# Patient Record
Sex: Male | Born: 1990
Health system: Southern US, Community
[De-identification: ages and names within clinical notes are randomized; demographics above are authoritative.]

## PROBLEM LIST (undated history)

## (undated) DIAGNOSIS — G43909 Migraine, unspecified, not intractable, without status migrainosus: Secondary | ICD-10-CM

## (undated) DIAGNOSIS — Q2381 Bicuspid aortic valve: Secondary | ICD-10-CM

## (undated) DIAGNOSIS — K311 Adult hypertrophic pyloric stenosis: Secondary | ICD-10-CM

## (undated) DIAGNOSIS — Q231 Congenital insufficiency of aortic valve: Secondary | ICD-10-CM

## (undated) HISTORY — PX: ABDOMINAL SURGERY: SHX537

## (undated) HISTORY — DX: Bicuspid aortic valve: Q23.81

## (undated) HISTORY — DX: Congenital insufficiency of aortic valve: Q23.1

## (undated) HISTORY — PX: TONSILLECTOMY: SUR1361

## (undated) HISTORY — PX: PYLOROMYOTOMY: SUR1063

---

## 2015-01-26 ENCOUNTER — Encounter: Payer: Self-pay | Admitting: *Deleted

## 2015-01-26 ENCOUNTER — Emergency Department
Admission: EM | Admit: 2015-01-26 | Discharge: 2015-01-26 | Disposition: A | Discharge status: Home / Self Care | Payer: Self-pay | Source: Home / Self Care | Attending: Family Medicine | Admitting: Family Medicine

## 2015-01-26 DIAGNOSIS — J029 Acute pharyngitis, unspecified: Secondary | ICD-10-CM

## 2015-01-26 HISTORY — DX: Migraine, unspecified, not intractable, without status migrainosus: G43.909

## 2015-01-26 HISTORY — DX: Adult hypertrophic pyloric stenosis: K31.1

## 2015-01-26 LAB — POCT RAPID STREP A (OFFICE): Rapid Strep A Screen: NEGATIVE

## 2015-01-26 MED ORDER — AMOXICILLIN 875 MG PO TABS: 875.0000 mg | ORAL_TABLET | Freq: Two times a day (BID) | ORAL | Status: DC

## 2015-01-26 MED ORDER — LIDOCAINE VISCOUS 2 % MT SOLN: 15.0000 mL | Freq: Three times a day (TID) | OROMUCOSAL | Status: DC

## 2015-01-26 MED ORDER — GUAIFENESIN-CODEINE 100-10 MG/5ML PO SOLN: ORAL | Status: DC

## 2015-01-26 NOTE — ED Notes (Signed)
Pt c/o sore throat, cough, chills and fever x 3 days.

## 2015-01-26 NOTE — ED Provider Notes (Signed)
CSN: [id number]     Arrival date & time 01/26/15  1555 History   First MD Initiated Contact with Patient 01/26/15 1622     Chief Complaint  Patient presents with  . Sore Throat      HPI Comments: Patient developed a sore throat three days ago which has become worse.  He has had myalgias, headache, fever, and chills.  He has had a mild cough over the past two days.  The history is provided by the patient.    Past Medical History  Diagnosis Date  . Migraine   . Aortic valve defect   . Pyloric stenosis    Past Surgical History  Procedure Laterality Date  . Pyloromyotomy    . Tonsillectomy    . Abdominal surgery     Family History  Problem Relation Age of Onset  . Hyperlipidemia Mother   . Hypertension Mother   . Migraines Mother   . Heart disease Father    History  Substance Use Topics  . Smoking status: Never Smoker   . Smokeless tobacco: Not on file  . Alcohol Use: No    Review of Systems + sore throat + hoarse + cough No pleuritic pain No wheezing + nasal congestion ? post-nasal drainage No sinus pain/pressure No itchy/red eyes No earache No hemoptysis No SOB + fever, + chills + nausea + vomiting, resolved No abdominal pain No diarrhea No urinary symptoms No skin rash + fatigue + myalgias + headache Used OTC meds without relief  Allergies  Review of patient's allergies indicates no known allergies.  Home Medications   Prior to Admission medications   Medication Sig Start Date End Date Taking? Authorizing Provider  SUMAtriptan-naproxen (TREXIMET) 85-500 MG per tablet Take 1 tablet by mouth every 2 (two) hours as needed for migraine.   Yes Historical Provider, MD  amoxicillin (AMOXIL) 875 MG tablet Take 1 tablet (875 mg total) by mouth 2 (two) times daily. 01/26/15   Lattie HawStephen A Beese, MD  guaiFENesin-codeine 100-10 MG/5ML syrup Take 10mL by mouth at bedtime as needed for cough 01/26/15   Lattie HawStephen A Beese, MD  lidocaine (XYLOCAINE) 2 % solution Use as  directed 15 mLs in the mouth or throat 4 (four) times daily -  before meals and at bedtime. Gargle, then expectorate 01/26/15   Lattie HawStephen A Beese, MD   BP 122/76 mmHg  Pulse 103  Temp(Src) 99.5 F (37.5 C) (Oral)  Resp 18  Ht 5\' 8"  (1.727 m)  Wt 234 lb (106.142 kg)  BMI 35.59 kg/m2  SpO2 99% Physical Exam Nursing notes and Vital Signs reviewed. Appearance:  Patient appears stated age, and in no acute distress.  Patient is obese (BMI 35.6) Eyes:  Pupils are equal, round, and reactive to light and accomodation.  Extraocular movement is intact.  Conjunctivae are not inflamed  Ears:  Canals normal.  Tympanic membranes normal.  Nose:  Mildly congested turbinates.  No sinus tenderness.    Pharynx:  Erythematous and slightly swollen without obstruction.  Neck:  Supple.  No adenopathy Lungs:  Clear to auscultation.  Breath sounds are equal.  Heart:  Regular rate and rhythm without murmurs, rubs, or gallops.  Abdomen:  Nontender without masses or hepatosplenomegaly.  Bowel sounds are present.  No CVA or flank tenderness.  Extremities:  No edema.  No calf tenderness Skin:  No rash present.   ED Course  Procedures  None    Labs Reviewed  STREP A DNA PROBE  POCT RAPID STREP A (  OFFICE) negative      MDM   1. Acute pharyngitis, unspecified pharyngitis type; ?false negative rapid strep.  Suspect viral URI also    Throat culture pending.  Begin amoxicillin  BID.  Robitussin AC at bedtime prn.  Lidocaine viscous gargles. Take plain guaifenesin  (Mucinex) twice daily, with plenty of water, for cough and congestion.  May add Pseudoephedrine for sinus congestion.  Get adequate rest.   Try warm salt water gargles for sore throat.  Stop all antihistamines for now, and other non-prescription cough/cold preparations. May take Ibuprofen , 4 tabs every 8 hours (or Aleve two tabs every 12 hours) with food for sore throat, headache, etc.    Lattie Haw, MD 01/30/15 1022

## 2015-01-26 NOTE — Discharge Instructions (Signed)
Take plain guaifenesin 1200mg  (Mucinex) twice daily, with plenty of water, for cough and congestion.  May add Pseudoephedrine for sinus congestion.  Get adequate rest.   Try warm salt water gargles for sore throat.  Stop all antihistamines for now, and other non-prescription cough/cold preparations. May take Ibuprofen 200mg , 4 tabs every 8 hours (or Aleve two tabs every 12 hours) with food for sore throat, headache, etc.

## 2015-01-27 LAB — STREP A DNA PROBE: GASP: NEGATIVE

## 2015-01-31 ENCOUNTER — Telehealth: Payer: Self-pay | Admitting: Emergency Medicine

## 2018-10-13 ENCOUNTER — Encounter: Payer: Self-pay | Admitting: Physician Assistant

## 2018-10-13 ENCOUNTER — Ambulatory Visit (INDEPENDENT_AMBULATORY_CARE_PROVIDER_SITE_OTHER): Payer: BLUE CROSS/BLUE SHIELD | Admitting: Physician Assistant

## 2018-10-13 VITALS — BP 115/72 | HR 59 | Temp 97.9°F | Ht 69.0 in | Wt 174.3 lb

## 2018-10-13 DIAGNOSIS — F341 Dysthymic disorder: Secondary | ICD-10-CM

## 2018-10-13 DIAGNOSIS — Z131 Encounter for screening for diabetes mellitus: Secondary | ICD-10-CM

## 2018-10-13 DIAGNOSIS — I351 Nonrheumatic aortic (valve) insufficiency: Secondary | ICD-10-CM | POA: Diagnosis not present

## 2018-10-13 DIAGNOSIS — G43009 Migraine without aura, not intractable, without status migrainosus: Secondary | ICD-10-CM

## 2018-10-13 DIAGNOSIS — Z130 Encounter for screening for diseases of the blood and blood-forming organs and certain disorders involving the immune mechanism: Secondary | ICD-10-CM

## 2018-10-13 DIAGNOSIS — Z7689 Persons encountering health services in other specified circumstances: Secondary | ICD-10-CM

## 2018-10-13 DIAGNOSIS — Z23 Encounter for immunization: Secondary | ICD-10-CM | POA: Diagnosis not present

## 2018-10-13 DIAGNOSIS — Z13220 Encounter for screening for lipoid disorders: Secondary | ICD-10-CM

## 2018-10-13 DIAGNOSIS — Q231 Congenital insufficiency of aortic valve: Secondary | ICD-10-CM | POA: Diagnosis not present

## 2018-10-13 MED ORDER — ESCITALOPRAM OXALATE 5 MG PO TABS: 5.0000 mg | ORAL_TABLET | Freq: Every day | ORAL | 3 refills | Status: DC

## 2018-10-13 MED ORDER — SUMATRIPTAN SUCCINATE 50 MG PO TABS: 50.0000 mg | ORAL_TABLET | ORAL | 2 refills | Status: DC | PRN

## 2018-10-13 NOTE — Patient Instructions (Signed)
For your migraines - keep track of your headache days with a log or diary (paper or app like MigraineBuddy, iHeadache) - optional B2 (riboflavin) and Magnesium for prophylaxis - take Imitrex at the first sign of migraine for abortive therapy - may repeat once after 2 hours if headache persists - do not use more than twice in 24 hours. And try not to use abortive medication more than 6 times per month - work on sleep hygiene. Stick to the same sleep schedule 7 days per week. - drink 1.5-2 L off water per day - reduce caffeine intake - avoid alcohol - don't skip meals   Migraine Headache A migraine headache is an intense, throbbing pain on one side or both sides of the head. Migraines may also cause other symptoms, such as nausea, vomiting, and sensitivity to light and noise. What are the causes? Doing or taking certain things may also trigger migraines, such as:  Alcohol.  Smoking.  Medicines, such as: ? Medicine used to treat chest pain (nitroglycerine). ? Birth control pills. ? Estrogen pills. ? Certain blood pressure medicines.  Aged cheeses, chocolate, or caffeine.  Foods or drinks that contain nitrates, glutamate, aspartame, or tyramine.  Physical activity.  Other things that may trigger a migraine include:  Menstruation.  Pregnancy.  Hunger.  Stress, lack of sleep, too much sleep, or fatigue.  Weather changes.  What increases the risk? The following factors may make you more likely to experience migraine headaches:  Age. Risk increases with age.  Family history of migraine headaches.  Being Caucasian.  Depression and anxiety.  Obesity.  Being a woman.  Having a hole in the heart (patent foramen ovale) or other heart problems.  What are the signs or symptoms? The main symptom of this condition is pulsating or throbbing pain. Pain may:  Happen in any area of the head, such as on one side or both sides.  Interfere with daily activities.  Get  worse with physical activity.  Get worse with exposure to bright lights or loud noises.  Other symptoms may include:  Nausea.  Vomiting.  Dizziness.  General sensitivity to bright lights, loud noises, or smells.  Before you get a migraine, you may get warning signs that a migraine is developing (aura). An aura may include:  Seeing flashing lights or having blind spots.  Seeing bright spots, halos, or zigzag lines.  Having tunnel vision or blurred vision.  Having numbness or a tingling feeling.  Having trouble talking.  Having muscle weakness.  How is this diagnosed? A migraine headache can be diagnosed based on:  Your symptoms.  A physical exam.  Tests, such as CT scan or MRI of the head. These imaging tests can help rule out other causes of headaches.  Taking fluid from the spine (lumbar puncture) and analyzing it (cerebrospinal fluid analysis, or CSF analysis).  How is this treated? A migraine headache is usually treated with medicines that:  Relieve pain.  Relieve nausea.  Prevent migraines from coming back.  Treatment may also include:  Acupuncture.  Lifestyle changes like avoiding foods that trigger migraines.  Follow these instructions at home: Medicines  Take over-the-counter and prescription medicines only as told by your health care provider.  Do not drive or use heavy machinery while taking prescription pain medicine.  To prevent or treat constipation while you are taking prescription pain medicine, your health care provider may recommend that you: ? Drink enough fluid to keep your urine clear or pale yellow. ?  Take over-the-counter or prescription medicines. ? Eat foods that are high in fiber, such as fresh fruits and vegetables, whole grains, and beans. ? Limit foods that are high in fat and processed sugars, such as fried and sweet foods. Lifestyle  Avoid alcohol use.  Do not use any products that contain nicotine or tobacco, such as  cigarettes and e-cigarettes. If you need help quitting, ask your health care provider.  Get at least 8 hours of sleep every night.  Limit your stress. General instructions   Keep a journal to find out what may trigger your migraine headaches. For example, write down: ? What you eat and drink. ? How much sleep you get. ? Any change to your diet or medicines.  If you have a migraine: ? Avoid things that make your symptoms worse, such as bright lights. ? It may help to lie down in a dark, quiet room. ? Do not drive or use heavy machinery. ? Ask your health care provider what activities are safe for you while you are experiencing symptoms.  Keep all follow-up visits as told by your health care provider. This is important. Contact a health care provider if:  You develop symptoms that are different or more severe than your usual migraine symptoms. Get help right away if:  Your migraine becomes severe.  You have a fever.  You have a stiff neck.  You have vision loss.  Your muscles feel weak or like you cannot control them.  You start to lose your balance often.  You develop trouble walking.  You faint. This information is not intended to replace advice given to you by your health care provider. Make sure you discuss any questions you have with your health care provider. Document Released: 12/03/2005 Document Revised: 06/22/2016 Document Reviewed: 05/21/2016 Elsevier Interactive Patient Education  2017 [employer].

## 2018-10-13 NOTE — Progress Notes (Signed)
HPI:                                                                Jake [facility] is a 27 y.o. male who presents to [facility] today to establish care  Current concerns:   Migraines: reports he was diagnosed with migraines at age 61. States they went away for years and returned in the last year. States headaches are unilateral, associated with blurred vision and photophobia, no aura. Denies paresthesias or focal weakness. Denies syncope or seizure activity. Reports he is having 1 headache per month lasting several hours. Not missing any work or school.  Relieved by sleep and Excedrin migraine.    Bicuspid aortic valve. Last echo was 05/2010 and showed mild AI. He denies change in exercise tolerance, dyspnea on exertion, cyanosis, syncope. He frequently runs long distances (6-10 miles several days per week)  States he thinks he may be depressed. He feels bored when he is with friends or playing games he used to enjoy. He used to take Lexapro and states this worked well for him. Denies symptoms of mania/hypomania. Denies suicidal thinking. Denies auditory/visual hallucinations.    Depression screen PHQ 2/9 10/13/2018  Decreased Interest 1  Down, Depressed, Hopeless 0  PHQ - 2 Score 1  Altered sleeping 1  Tired, decreased energy 0  Change in appetite 0  Feeling bad or failure about yourself  0  Trouble concentrating 0  Moving slowly or fidgety/restless 0  Suicidal thoughts 0  PHQ-9 Score 2  Difficult doing work/chores Not difficult at all    GAD 7 : Generalized Anxiety Score 10/13/2018  Nervous, Anxious, on Edge 0  Control/stop worrying 1  Worry too much - different things 0  Trouble relaxing 0  Restless 0  Easily annoyed or irritable 0  Afraid - awful might happen 0  Total GAD 7 Score 1  Anxiety Difficulty Not difficult at all      Past Medical History:  Diagnosis Date  . Aortic valve defect   . Migraine   . Pyloric  stenosis    Past Surgical History:  Procedure Laterality Date  . ABDOMINAL SURGERY    . PYLOROMYOTOMY    . TONSILLECTOMY     Social History   Tobacco Use  . Smoking status: Never Smoker  . Smokeless tobacco: Never Used  Substance Use Topics  . Alcohol use: Not Currently   family history includes Heart disease in his father; Hyperlipidemia in his mother; Hypertension in his mother; Migraines in his mother.    ROS: negative except as noted in the HPI  Medications: Current Outpatient Medications  Medication Sig Dispense Refill  . escitalopram (LEXAPRO) 5 MG tablet Take 1 tablet (5 mg total) by mouth at bedtime. 30 tablet 3  . SUMAtriptan (IMITREX) 50 MG tablet Take 1 tablet (50 mg total) by mouth every 2 (two) hours as needed for migraine. May repeat in 2 hours if headache persists or recurs. 10 tablet 2   No current facility-administered medications for this visit.    No Known Allergies     Objective:  BP 115/72   Pulse (!) 59   Temp 97.9 F (36.6 C) (Oral)   Ht 5\' 9"  (1.753 m)   Wt  174 lb 4.8 oz (79.1 kg)   SpO2 98%   BMI 25.74 kg/m  Gen:  alert, not ill-appearing, no distress, appropriate for age HEENT: head normocephalic without obvious abnormality, conjunctiva and cornea clear, trachea midline Pulm: Normal work of breathing, normal phonation, clear to auscultation bilaterally, no wheezes, rales or rhonchi CV: Normal rate, regular rhythm, s1 and s2 distinct, grade II/VI systolic murmur Neuro: alert and oriented x 3, no tremor MSK: extremities atraumatic, normal gait and station Skin: intact, no rashes on exposed skin, no jaundice, no cyanosis Psych: well-groomed, cooperative, good eye contact, mood "I think I'm depressed. Bored", affect mood-congruent, speech is articulate, and thought processes clear and goal-directed    No results found for this or any previous visit (from the past 72 hour(s)). No results found.    Assessment and Plan: 27 y.o. male  with   .[facility] was seen today for establish care.  Diagnoses and all orders for this visit:  Encounter to establish care  Bicuspid aortic valve -     ECHOCARDIOGRAM COMPLETE; Future -     ECHOCARDIOGRAM COMPLETE  Mild aortic insufficiency -     ECHOCARDIOGRAM COMPLETE; Future -     ECHOCARDIOGRAM COMPLETE  Dysthymia -     escitalopram (LEXAPRO) 5 MG tablet; Take 1 tablet (5 mg total) by mouth at bedtime.  Migraine without aura and without status migrainosus, not intractable -     SUMAtriptan (IMITREX) 50 MG tablet; Take 1 tablet (50 mg total) by mouth every 2 (two) hours as needed for migraine. May repeat in 2 hours if headache persists or recurs.  Screening for lipid disorders -     Lipid Panel w/reflex Direct LDL  Screening for diabetes mellitus -     Comprehensive metabolic panel  Screening for blood disease -     CBC -     Comprehensive metabolic panel  Need for immunization against influenza -     Flu Vaccine QUAD 36+ mos IM   - Personally reviewed PMH, PSH, PFH, medications, allergies, HM - Age-appropriate cancer screening: n/a - Influenza given today - Tdap UTD - PHQ2 negative  Migraine w/o aura - no red flag symptoms. 1 headache day per month. Not a candidate for preventive medication and he is not interested in this. Continue Excedrin prn. Re-start Imitrex prn  Dysthymia - PHQ9=2. Patient c/o anhedonia and desires to re-start Lexapro. Will start low-dose 5 mg   Bicuspid aortic valve - asymptomatic. It has been 8 years since his last echo. Personally reviewed report showing mild AI, normal LVEF. Repeat echo for surveillance of AI   Patient education and anticipatory guidance given Patient agrees with treatment plan Follow-up in 8 weeks for migraines / mood or sooner as needed if symptoms worsen or fail to improve  Jake Hubert PA-C

## 2018-10-14 DIAGNOSIS — Z13220 Encounter for screening for lipoid disorders: Secondary | ICD-10-CM | POA: Diagnosis not present

## 2018-10-14 DIAGNOSIS — Z131 Encounter for screening for diabetes mellitus: Secondary | ICD-10-CM | POA: Diagnosis not present

## 2018-10-14 DIAGNOSIS — Z130 Encounter for screening for diseases of the blood and blood-forming organs and certain disorders involving the immune mechanism: Secondary | ICD-10-CM | POA: Diagnosis not present

## 2018-10-15 ENCOUNTER — Encounter: Payer: Self-pay | Admitting: Physician Assistant

## 2018-10-15 LAB — COMPREHENSIVE METABOLIC PANEL
AG Ratio: 1.8 (calc) (ref 1.0–2.5)
ALBUMIN MSPROF: 4.6 g/dL (ref 3.6–5.1)
ALKALINE PHOSPHATASE (APISO): 86 U/L (ref 40–115)
ALT: 29 U/L (ref 9–46)
AST: 33 U/L (ref 10–40)
BUN/Creatinine Ratio: 25 (calc) — ABNORMAL HIGH (ref 6–22)
BUN: 26 mg/dL — AB (ref 7–25)
CALCIUM: 10.1 mg/dL (ref 8.6–10.3)
CO2: 28 mmol/L (ref 20–32)
Chloride: 101 mmol/L (ref 98–110)
Creat: 1.05 mg/dL (ref 0.60–1.35)
Globulin: 2.5 g/dL (calc) (ref 1.9–3.7)
Glucose, Bld: 85 mg/dL (ref 65–99)
Potassium: 3.8 mmol/L (ref 3.5–5.3)
Sodium: 139 mmol/L (ref 135–146)
Total Bilirubin: 2.1 mg/dL — ABNORMAL HIGH (ref 0.2–1.2)
Total Protein: 7.1 g/dL (ref 6.1–8.1)

## 2018-10-15 LAB — CBC
HCT: 45 % (ref 38.5–50.0)
HEMOGLOBIN: 15.9 g/dL (ref 13.2–17.1)
MCH: 31.5 pg (ref 27.0–33.0)
MCHC: 35.3 g/dL (ref 32.0–36.0)
MCV: 89.3 fL (ref 80.0–100.0)
MPV: 10.3 fL (ref 7.5–12.5)
Platelets: 185 10*3/uL (ref 140–400)
RBC: 5.04 10*6/uL (ref 4.20–5.80)
RDW: 12 % (ref 11.0–15.0)
WBC: 7.8 10*3/uL (ref 3.8–10.8)

## 2018-10-15 LAB — LIPID PANEL W/REFLEX DIRECT LDL
CHOLESTEROL: 163 mg/dL (ref <200)
HDL: 65 mg/dL (ref >40)
LDL CHOLESTEROL (CALC): 82 mg/dL
Non-HDL Cholesterol (Calc): 98 mg/dL (calc) (ref <130)
Total CHOL/HDL Ratio: 2.5 (calc) (ref <5.0)
Triglycerides: 76 mg/dL (ref <150)

## 2018-10-31 ENCOUNTER — Ambulatory Visit (HOSPITAL_BASED_OUTPATIENT_CLINIC_OR_DEPARTMENT_OTHER)
Admission: RE | Admit: 2018-10-31 | Discharge: 2018-10-31 | Disposition: A | Discharge status: Home / Self Care | Payer: BLUE CROSS/BLUE SHIELD | Source: Ambulatory Visit | Attending: Physician Assistant | Admitting: Physician Assistant

## 2018-10-31 DIAGNOSIS — Q231 Congenital insufficiency of aortic valve: Secondary | ICD-10-CM

## 2018-10-31 DIAGNOSIS — I351 Nonrheumatic aortic (valve) insufficiency: Secondary | ICD-10-CM | POA: Diagnosis not present

## 2018-10-31 NOTE — Progress Notes (Signed)
  Echocardiogram 2D Echocardiogram has been performed.  Elkin Belfield T Neda Willenbring 10/31/2018, 10:51 AM

## 2018-11-03 NOTE — Addendum Note (Signed)
Addended by: Gena FrayUMMINGS, CHARLEY E on: 11/03/2018 08:24 AM   Modules accepted: Orders

## 2018-12-29 NOTE — Progress Notes (Deleted)
Referring-Charlie Doran Heater, PA-C Reason for referral-bicuspid aortic valve  HPI: 28 year old male for evaluation of bicuspid aortic valve at request of Mardelle Matte, PA-C.  Last echocardiogram November 2019 showed normal LV function, bicuspid aortic valve with mild aortic insufficiency.  Current Outpatient Medications  Medication Sig Dispense Refill  . escitalopram (LEXAPRO) 5 MG tablet Take 1 tablet (5 mg total) by mouth at bedtime. 30 tablet 3  . SUMAtriptan (IMITREX) 50 MG tablet Take 1 tablet (50 mg total) by mouth every 2 (two) hours as needed for migraine. May repeat in 2 hours if headache persists or recurs. 10 tablet 2   No current facility-administered medications for this visit.     No Known Allergies  Past Medical History:  Diagnosis Date  . Aortic valve defect   . Migraine   . Pyloric stenosis     Past Surgical History:  Procedure Laterality Date  . ABDOMINAL SURGERY    . PYLOROMYOTOMY    . TONSILLECTOMY      Social History   Socioeconomic History  . Marital status: Single    Spouse name: Not on file  . Number of children: Not on file  . Years of education: Not on file  . Highest education level: Not on file  Occupational History  . Not on file  Social Needs  . Financial resource strain: Not on file  . Food insecurity:    Worry: Not on file    Inability: Not on file  . Transportation needs:    Medical: Not on file    Non-medical: Not on file  Tobacco Use  . Smoking status: Never Smoker  . Smokeless tobacco: Never Used  Substance and Sexual Activity  . Alcohol use: Not Currently  . Drug use: Never  . Sexual activity: Yes    Birth control/protection: Condom  Lifestyle  . Physical activity:    Days per week: Not on file    Minutes per session: Not on file  . Stress: Not on file  Relationships  . Social connections:    Talks on phone: Not on file    Gets together: Not on file    Attends religious service: Not on  file    Active member of club or organization: Not on file    Attends meetings of clubs or organizations: Not on file    Relationship status: Not on file  . Intimate partner violence:    Fear of current or ex partner: Not on file    Emotionally abused: Not on file    Physically abused: Not on file    Forced sexual activity: Not on file  Other Topics Concern  . Not on file  Social History Narrative  . Not on file    Family History  Problem Relation Age of Onset  . Hyperlipidemia Mother   . Hypertension Mother   . Migraines Mother   . Heart disease Father     ROS: no fevers or chills, productive cough, hemoptysis, dysphasia, odynophagia, melena, hematochezia, dysuria, hematuria, rash, seizure activity, orthopnea, PND, pedal edema, claudication. Remaining systems are negative.  Physical Exam:   There were no vitals taken for this visit.  General:  Well developed/well nourished in NAD Skin warm/dry Patient not depressed No peripheral clubbing Back-normal HEENT-normal/normal eyelids Neck supple/normal carotid upstroke bilaterally; no bruits; no JVD; no thyromegaly chest - CTA/ normal expansion CV - RRR/normal S1 and S2; no murmurs, rubs or gallops;  PMI nondisplaced Abdomen -NT/ND, no HSM, no mass, +  bowel sounds, no bruit 2+ femoral pulses, no bruits Ext-no edema, chords, 2+ DP Neuro-grossly nonfocal  ECG - personally reviewed  A/P  1  Olga Millers, MD

## 2018-12-31 ENCOUNTER — Ambulatory Visit: Payer: BLUE CROSS/BLUE SHIELD | Admitting: Cardiology

## 2019-01-05 NOTE — Progress Notes (Signed)
Referring-Charley Doran HeaterElizabeth Cummings, PA-C Reason for referral-bicuspid aortic valve  HPI: 28 year old male for evaluation of bicuspid aortic valve at request of Carlis Stableharley Elizabeth Cummings, PA-C.  Echocardiogram November 2019 showed normal LV function, bicuspid aortic valve, mild aortic insufficiency.  There is no dyspnea, chest pain, palpitations or syncope.  Current Outpatient Medications  Medication Sig Dispense Refill  . escitalopram (LEXAPRO) 5 MG tablet Take 1 tablet (5 mg total) by mouth at bedtime. 30 tablet 3  . SUMAtriptan (IMITREX) 50 MG tablet Take 1 tablet (50 mg total) by mouth every 2 (two) hours as needed for migraine. May repeat in 2 hours if headache persists or recurs. 10 tablet 2   No current facility-administered medications for this visit.     No Known Allergies   Past Medical History:  Diagnosis Date  . Bicuspid aortic valve   . Migraine   . Pyloric stenosis     Past Surgical History:  Procedure Laterality Date  . ABDOMINAL SURGERY    . PYLOROMYOTOMY    . TONSILLECTOMY      Social History   Socioeconomic History  . Marital status: Single    Spouse name: Not on file  . Number of children: Not on file  . Years of education: Not on file  . Highest education level: Not on file  Occupational History  . Not on file  Social Needs  . Financial resource strain: Not on file  . Food insecurity:    Worry: Not on file    Inability: Not on file  . Transportation needs:    Medical: Not on file    Non-medical: Not on file  Tobacco Use  . Smoking status: Never Smoker  . Smokeless tobacco: Never Used  Substance and Sexual Activity  . Alcohol use: Not Currently  . Drug use: Never  . Sexual activity: Yes    Birth control/protection: Condom  Lifestyle  . Physical activity:    Days per week: Not on file    Minutes per session: Not on file  . Stress: Not on file  Relationships  . Social connections:    Talks on phone: Not on file    Gets together:  Not on file    Attends religious service: Not on file    Active member of club or organization: Not on file    Attends meetings of clubs or organizations: Not on file    Relationship status: Not on file  . Intimate partner violence:    Fear of current or ex partner: Not on file    Emotionally abused: Not on file    Physically abused: Not on file    Forced sexual activity: Not on file  Other Topics Concern  . Not on file  Social History Narrative  . Not on file    Family History  Problem Relation Age of Onset  . Hyperlipidemia Mother   . Hypertension Mother   . Migraines Mother   . Heart disease Father     ROS: no fevers or chills, productive cough, hemoptysis, dysphasia, odynophagia, melena, hematochezia, dysuria, hematuria, rash, seizure activity, orthopnea, PND, pedal edema, claudication. Remaining systems are negative.  Physical Exam:   Blood pressure 114/72, pulse 63, height 5\' 9"  (1.753 m), weight 178 lb (80.7 kg).  General:  Well developed/well nourished in NAD Skin warm/dry Patient not depressed No peripheral clubbing Back-normal HEENT-normal/normal eyelids Neck supple/normal carotid upstroke bilaterally; no bruits; no JVD; no thyromegaly chest - CTA/ normal expansion CV - RRR/normal S1  and S2; no murmurs, rubs or gallops;  PMI nondisplaced Abdomen -NT/ND, no HSM, no mass, + bowel sounds, no bruit 2+ femoral pulses, no bruits Ext-no edema, chords, 2+ DP Neuro-grossly nonfocal  ECG -normal sinus rhythm at a rate of 63.  No ST changes.  Personally reviewed  A/P  1 bicuspid aortic valve-I have personally reviewed the patient's recent transthoracic echocardiogram.  He has a bicuspid valve with mild aortic insufficiency.  He will need follow-up studies in the future.  We will arrange a CTA to rule out thoracic aortic aneurysm.  I will also order an MRA of his cerebral circulation to exclude cerebral aneurysm given increased incidence in patients with bicuspid aortic  valves.  2 HA-occasional migraines; given bicuspid aortic valve and association with cerebral aneurysms will arrange MRA to exclude.  Olga Millers, MD

## 2019-01-06 ENCOUNTER — Ambulatory Visit (INDEPENDENT_AMBULATORY_CARE_PROVIDER_SITE_OTHER): Payer: BLUE CROSS/BLUE SHIELD | Admitting: Cardiology

## 2019-01-06 ENCOUNTER — Encounter: Payer: Self-pay | Admitting: Cardiology

## 2019-01-06 VITALS — BP 114/72 | HR 63 | Ht 69.0 in | Wt 178.0 lb

## 2019-01-06 DIAGNOSIS — Q231 Congenital insufficiency of aortic valve: Secondary | ICD-10-CM

## 2019-01-06 DIAGNOSIS — G4489 Other headache syndrome: Secondary | ICD-10-CM

## 2019-01-06 NOTE — Patient Instructions (Signed)
Medication Instructions:  NO CHANGE If you need a refill on your cardiac medications before your next appointment, please call your pharmacy.   Lab work: If you have labs (blood work) drawn today and your tests are completely normal, you will receive your results only by: Marland Kitchen MyChart Message (if you have MyChart) OR . A paper copy in the mail If you have any lab test that is abnormal or we need to change your treatment, we will call you to review the results.  Testing/Procedures: CTA OF THE CHEST W/WO TO R/O THORACIC ANEURYSM=[address]  MRA OF THE HEAD TO R/O ANEURYSM=[address]  Follow-Up: At [facility], you and your health needs are our priority.  As part of our continuing mission to provide you with exceptional heart care, we have created designated Provider Care Teams.  These Care Teams include your primary Cardiologist (physician) and Advanced Practice Providers (APPs -  Physician Assistants and Nurse Practitioners) who all work together to provide you with the care you need, when you need it. You will need a follow up appointment in 24 months.  Please call our office 2 months in advance to schedule this appointment.  You may see Olga Millers MD or one of the following Advanced Practice Providers on your designated Care Team:   Corine Shelter, PA-C Judy Pimple, [place] . Marjie Skiff, PA-C

## 2019-01-14 ENCOUNTER — Ambulatory Visit
Admission: RE | Admit: 2019-01-14 | Discharge: 2019-01-14 | Disposition: A | Discharge status: Home / Self Care | Payer: BLUE CROSS/BLUE SHIELD | Source: Ambulatory Visit | Attending: Cardiology | Admitting: Cardiology

## 2019-01-14 DIAGNOSIS — Z952 Presence of prosthetic heart valve: Secondary | ICD-10-CM | POA: Diagnosis not present

## 2019-01-14 DIAGNOSIS — Q231 Congenital insufficiency of aortic valve: Secondary | ICD-10-CM

## 2019-01-14 DIAGNOSIS — I350 Nonrheumatic aortic (valve) stenosis: Secondary | ICD-10-CM | POA: Diagnosis not present

## 2019-01-14 MED ORDER — IOPAMIDOL (ISOVUE-370) INJECTION 76%
75.0000 mL | Freq: Once | INTRAVENOUS | Status: AC | PRN
  Administered 2019-01-14: 75 mL via INTRAVENOUS

## 2019-02-04 ENCOUNTER — Other Ambulatory Visit: Payer: Self-pay | Admitting: Physician Assistant

## 2019-02-04 DIAGNOSIS — F341 Dysthymic disorder: Secondary | ICD-10-CM

## 2019-03-10 ENCOUNTER — Other Ambulatory Visit: Payer: Self-pay | Admitting: Physician Assistant

## 2019-03-10 DIAGNOSIS — F341 Dysthymic disorder: Secondary | ICD-10-CM

## 2019-04-07 ENCOUNTER — Other Ambulatory Visit: Payer: Self-pay | Admitting: Physician Assistant

## 2019-04-07 DIAGNOSIS — F341 Dysthymic disorder: Secondary | ICD-10-CM

## 2019-04-20 ENCOUNTER — Other Ambulatory Visit: Payer: Self-pay | Admitting: Physician Assistant

## 2019-04-20 DIAGNOSIS — F341 Dysthymic disorder: Secondary | ICD-10-CM

## 2019-05-23 ENCOUNTER — Other Ambulatory Visit: Payer: Self-pay | Admitting: Physician Assistant

## 2019-05-23 DIAGNOSIS — F341 Dysthymic disorder: Secondary | ICD-10-CM

## 2019-10-21 ENCOUNTER — Ambulatory Visit (INDEPENDENT_AMBULATORY_CARE_PROVIDER_SITE_OTHER): Payer: BC Managed Care – PPO | Admitting: Physician Assistant

## 2019-10-21 ENCOUNTER — Encounter: Payer: Self-pay | Admitting: Physician Assistant

## 2019-10-21 VITALS — Ht 69.0 in | Wt 174.0 lb

## 2019-10-21 DIAGNOSIS — Z13220 Encounter for screening for lipoid disorders: Secondary | ICD-10-CM

## 2019-10-21 DIAGNOSIS — G43009 Migraine without aura, not intractable, without status migrainosus: Secondary | ICD-10-CM

## 2019-10-21 DIAGNOSIS — Z79899 Other long term (current) drug therapy: Secondary | ICD-10-CM

## 2019-10-21 DIAGNOSIS — Z131 Encounter for screening for diabetes mellitus: Secondary | ICD-10-CM

## 2019-10-21 DIAGNOSIS — F341 Dysthymic disorder: Secondary | ICD-10-CM | POA: Diagnosis not present

## 2019-10-21 MED ORDER — ESCITALOPRAM OXALATE 10 MG PO TABS: 10.0000 mg | ORAL_TABLET | Freq: Every day | ORAL | 3 refills | Status: AC

## 2019-10-21 MED ORDER — SUMATRIPTAN SUCCINATE 100 MG PO TABS: 100.0000 mg | ORAL_TABLET | ORAL | 5 refills | Status: DC | PRN

## 2019-10-21 NOTE — Progress Notes (Signed)
No issues. PHQ9-GAD7 completed.   

## 2019-10-21 NOTE — Progress Notes (Signed)
Patient ID: Jake Craig, male   DOB: [birth year], 28 y.o.   MRN: [id number] .Marland KitchenVirtual Visit via Video Note  I connected with Jake Craig on 10/21/19 at  2:20 PM EST by a video enabled telemedicine application and verified that I am speaking with the correct person using two identifiers.  Location: Patient: home Provider: clinic   I discussed the limitations of evaluation and management by telemedicine and the availability of in person appointments. The patient expressed understanding and agreed to proceed.  History of Present Illness: Patient is a 28 year old male with dysthymia and migraines who presents to the clinic for medication refills.  Overall patient feels like his mood is down a little more than it is used to being.  He has been on Lexapro 5 mg daily for over a year.  He feels like he could use an increase.  He denies any suicidal thoughts or homicidal idealizations.  Overall he has no side effects.  He is having 1-2 migraines per month but the 50 mg Imitrex does not seem to be rescuing his headaches without taking the second dose.  He wonders if he could have an increase.  .. Active Ambulatory Problems    Diagnosis Date Noted  . Bicuspid aortic valve 10/13/2018  . Mild aortic insufficiency 10/13/2018  . Dysthymia 10/13/2018  . Migraine without aura and without status migrainosus, not intractable 10/13/2018  . Hyperbilirubinemia 10/15/2018   Resolved Ambulatory Problems    Diagnosis Date Noted  . No Resolved Ambulatory Problems   Past Medical History:  Diagnosis Date  . Migraine   . Pyloric stenosis    Reviewed med, allergy, problem list.    Observations/Objective: No acute distress. Flat affect.  Normal appearance.  Normal breathing.  .. Depression screen [facility] 2/9 10/21/2019 10/13/2018  Decreased Interest 2 1  Down, Depressed, Hopeless 1 0  PHQ - 2 Score 3 1  Altered sleeping 1 1  Tired, decreased energy 1 0  Change in appetite 0 0  Feeling bad or failure  about yourself  0 0  Trouble concentrating 0 0  Moving slowly or fidgety/restless 0 0  Suicidal thoughts 0 0  PHQ-9 Score 5 2  Difficult doing work/chores Not difficult at all Not difficult at all   .Marland Kitchen GAD 7 : Generalized Anxiety Score 10/21/2019 10/13/2018  Nervous, Anxious, on Edge 1 0  Control/stop worrying 1 1  Worry too much - different things 1 0  Trouble relaxing 1 0  Restless 0 0  Easily annoyed or irritable 0 0  Afraid - awful might happen 0 0  Total GAD 7 Score 4 1  Anxiety Difficulty Not difficult at all Not difficult at all     Assessment and Plan: Marland KitchenMarland KitchenRob was seen today for depression.  Diagnoses and all orders for this visit:  Screening for lipid disorders -     Lipid Panel w/reflex Direct LDL  Dysthymia -     escitalopram (LEXAPRO) 10 MG tablet; Take 1 tablet (10 mg total) by mouth daily.  Screening for diabetes mellitus -     COMPLETE METABOLIC PANEL WITH GFR  Medication management -     COMPLETE METABOLIC PANEL WITH GFR -     CBC with Differential/Platelet  Migraine without aura and without status migrainosus, not intractable -     SUMAtriptan (IMITREX) 100 MG tablet; Take 1 tablet (100 mg total) by mouth every 2 (two) hours as needed for migraine. May repeat in 2 hours if headache persists or recurs.  Patient does feel like he would benefit from increasing dose of Lexapro.  Increase to 10 mg today.  Follow-up as needed.  His PHQ and GAD numbers did increase from last visit.  Overall he is not having any problems.  I would like for patient to get fasting labs.  They were ordered and faxed downstairs for him to come at his convenience.  Patient reports that 50 mg of Imitrex does not seem to be working as much therefore we increased it to 100 mg.  Follow-up as needed.   Follow Up Instructions:    I discussed the assessment and treatment plan with the patient. The patient was provided an opportunity to ask questions and all were answered. The patient  agreed with the plan and demonstrated an understanding of the instructions.   The patient was advised to call back or seek an in-person evaluation if the symptoms worsen or if the condition fails to improve as anticipated.     Tandy Gaw, PA-C

## 2020-01-14 DIAGNOSIS — Q231 Congenital insufficiency of aortic valve: Secondary | ICD-10-CM | POA: Diagnosis not present

## 2020-01-14 DIAGNOSIS — Z0000 Encounter for general adult medical examination without abnormal findings: Secondary | ICD-10-CM | POA: Diagnosis not present

## 2020-01-14 DIAGNOSIS — F411 Generalized anxiety disorder: Secondary | ICD-10-CM | POA: Diagnosis not present

## 2020-01-14 DIAGNOSIS — G43109 Migraine with aura, not intractable, without status migrainosus: Secondary | ICD-10-CM | POA: Diagnosis not present

## 2020-02-24 DIAGNOSIS — Z23 Encounter for immunization: Secondary | ICD-10-CM | POA: Diagnosis not present

## 2020-03-14 DIAGNOSIS — Z23 Encounter for immunization: Secondary | ICD-10-CM | POA: Diagnosis not present

## 2020-04-29 ENCOUNTER — Other Ambulatory Visit: Payer: Self-pay | Admitting: Physician Assistant

## 2020-04-29 DIAGNOSIS — G43009 Migraine without aura, not intractable, without status migrainosus: Secondary | ICD-10-CM

## 2020-05-02 NOTE — Telephone Encounter (Signed)
Needs to establish with new PCP

## 2020-05-20 ENCOUNTER — Other Ambulatory Visit: Payer: Self-pay | Admitting: Physician Assistant

## 2020-05-20 DIAGNOSIS — G43009 Migraine without aura, not intractable, without status migrainosus: Secondary | ICD-10-CM

## 2020-08-07 DIAGNOSIS — H60501 Unspecified acute noninfective otitis externa, right ear: Secondary | ICD-10-CM | POA: Diagnosis not present

## 2021-01-26 IMAGING — MR MR MRA HEAD W/O CM
1 series · 24 of 48 positions shown · non-contrast
Comparison: None.

CLINICAL DATA: Bicuspid aortic valve.  Rule out cerebral aneurysm

EXAM:
MRA HEAD WITHOUT CONTRAST
TECHNIQUE: Angiographic images of the Circle of Willis were obtained using MRA
technique without intravenous contrast.

[Series 4: tof_3d_multi-slab · axial · 0.7mm · 0.35mm/px · z∈[-32,+59]mm · 24 of 144 slices shown]
[im 1/144]
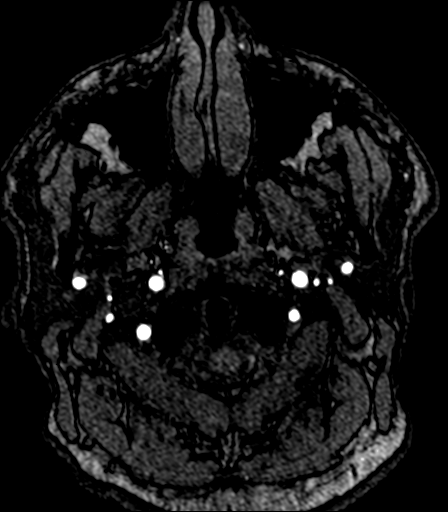
[im 4/144]
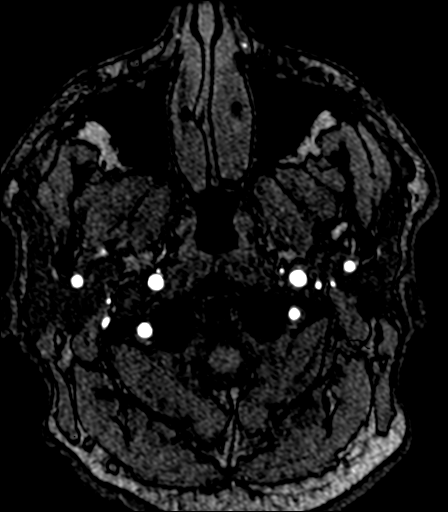
[im 7/144]
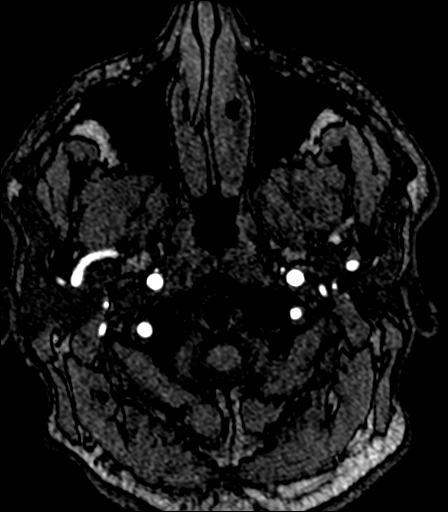
[im 10/144]
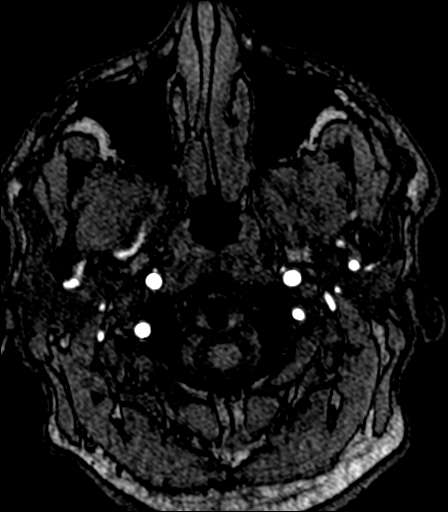
[im 13/144]
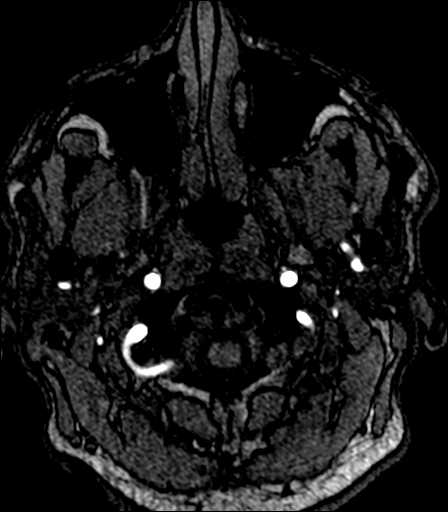
[im 16/144]
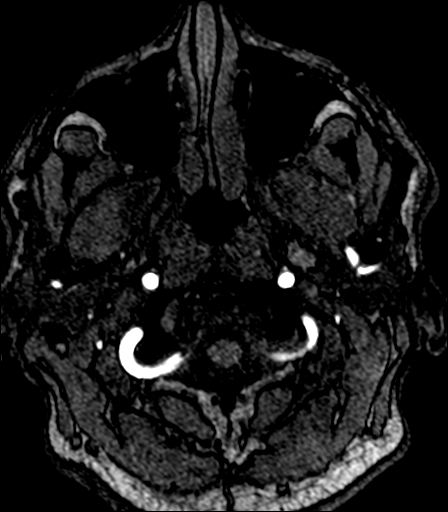
[im 19/144]
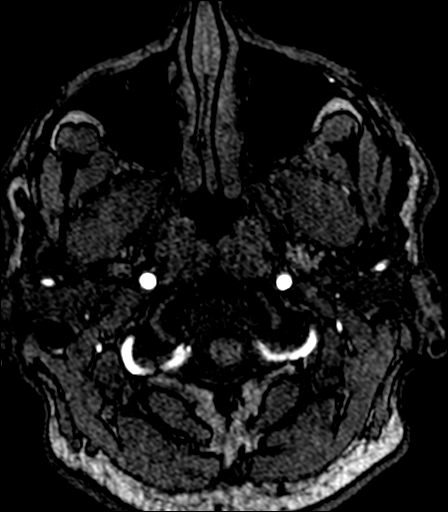
[im 22/144]
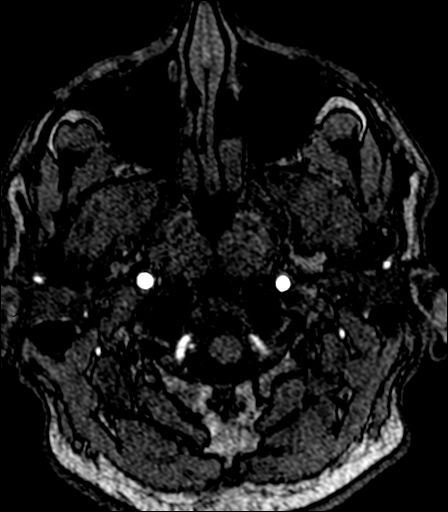
[im 25/144]
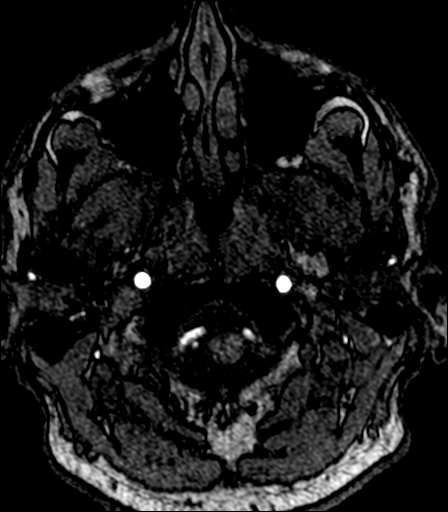
[im 28/144]
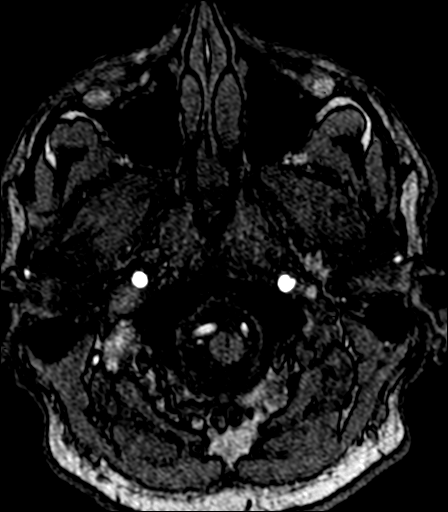
[im 31/144]
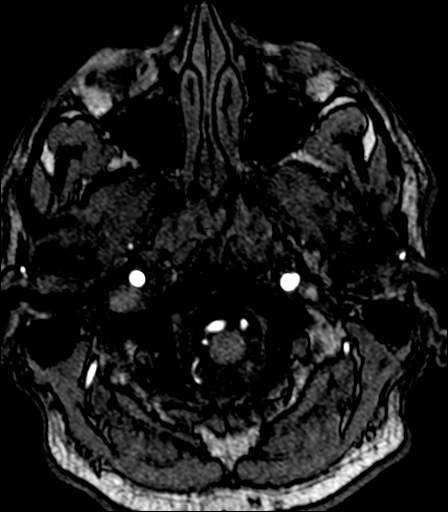
[im 34/144]
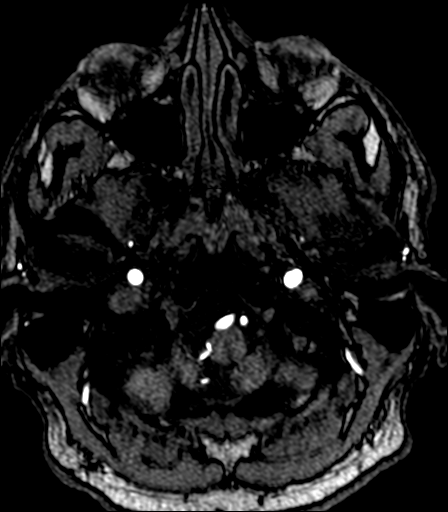
[im 37/144]
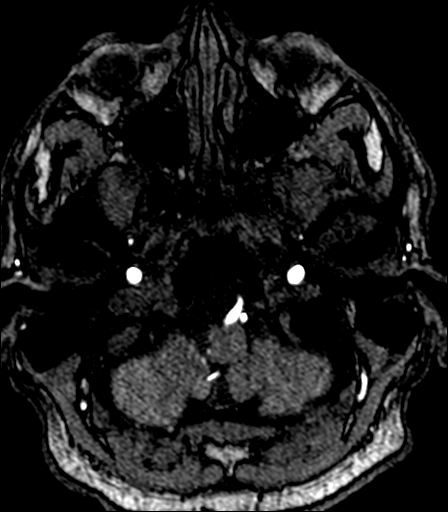
[im 40/144]
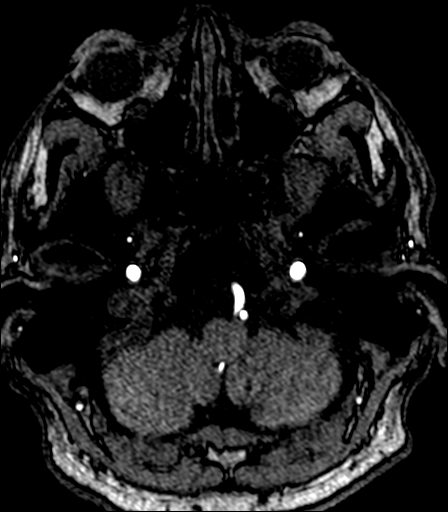
[im 43/144]
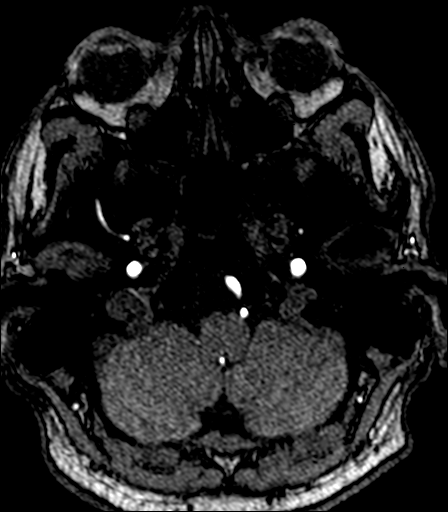
[im 46/144]
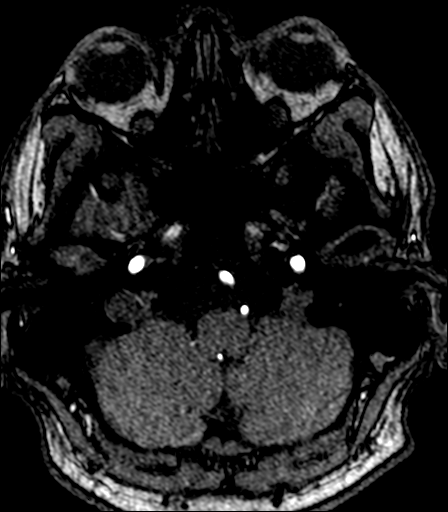
[im 49/144]
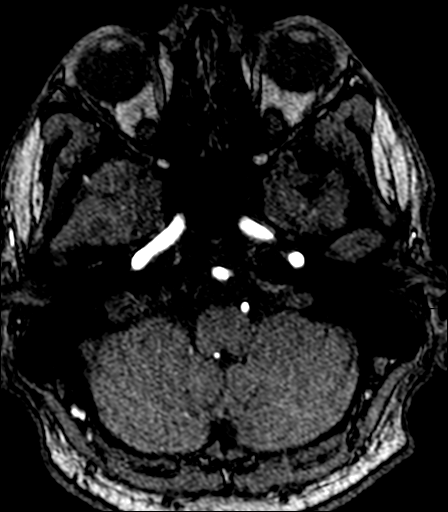
[im 64/144]
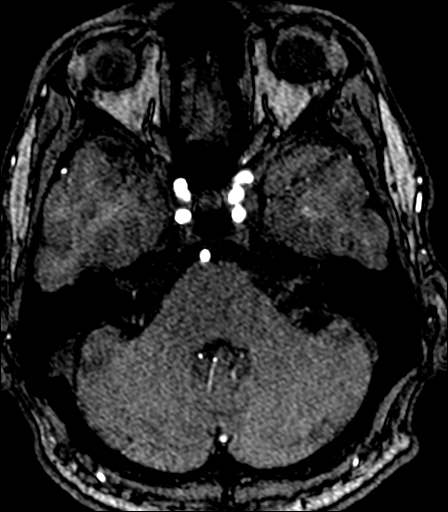
[im 74/144]
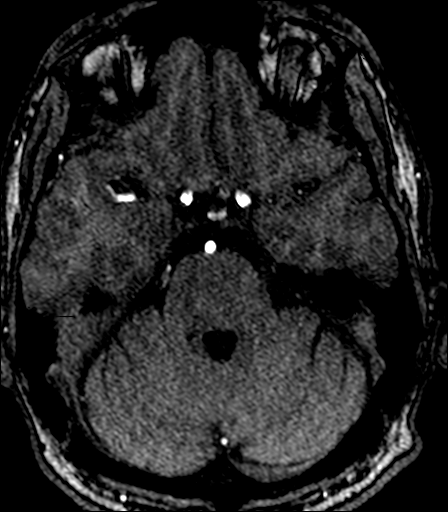
[im 83/144]
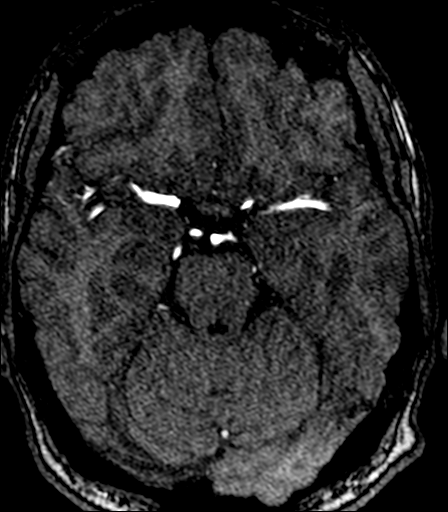
[im 101/144]
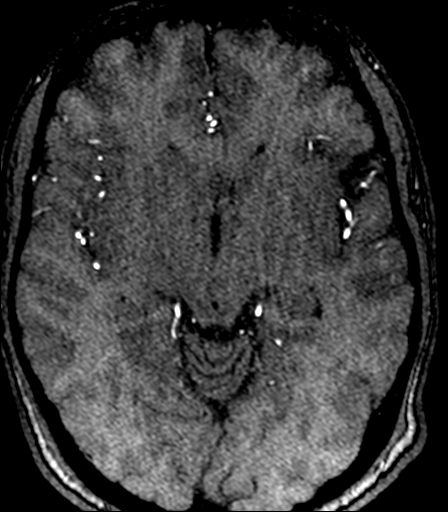
[im 119/144]
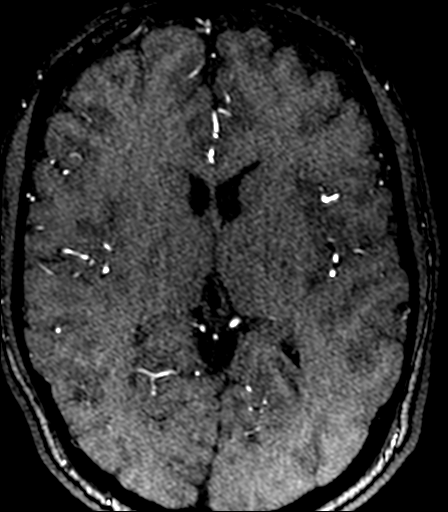
[im 122/144]
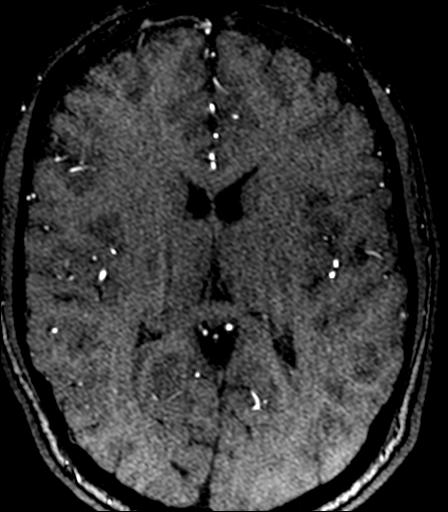
[im 137/144]
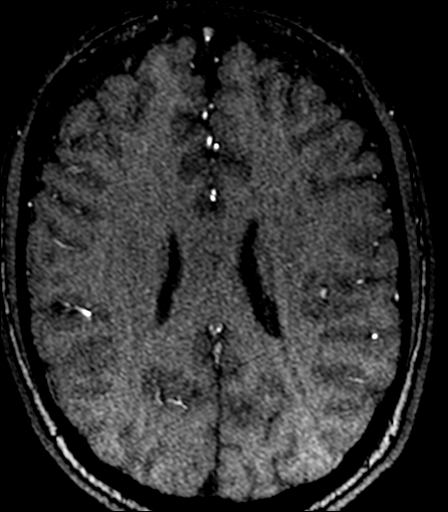

[24 of 48 positions shown; findings below may reference images not displayed]

FINDINGS: Negative for cerebral aneurysm.

No significant intracranial stenosis or large vessel occlusion.
Normal intracranial circulation.
IMPRESSION: Negative MRA head.  Negative for cerebral aneurysm.

## 2021-01-26 IMAGING — CT CT ANGIO CHEST
1 series · 19 of 32 positions shown · IV contrast (APPLIED)
Comparison: None.

CLINICAL DATA: History of bicuspid aortic valve with mild valvular
insufficiency.

EXAM:
CT ANGIOGRAPHY CHEST WITH CONTRAST
TECHNIQUE: Multidetector CT imaging of the chest was performed using the
standard protocol during bolus administration of intravenous
contrast. Multiplanar CT image reconstructions and MIPs were
obtained to evaluate the vascular anatomy.
CONTRAST:  75mL [id number]-KOW IOPAMIDOL (EMI4L4-KOW) INJECTION 76%

[Series 4: chest angio · axial · 0.77mm/px · z∈[-292,-22]mm · 19 of 98 slices shown]
[im 4/98  lung]
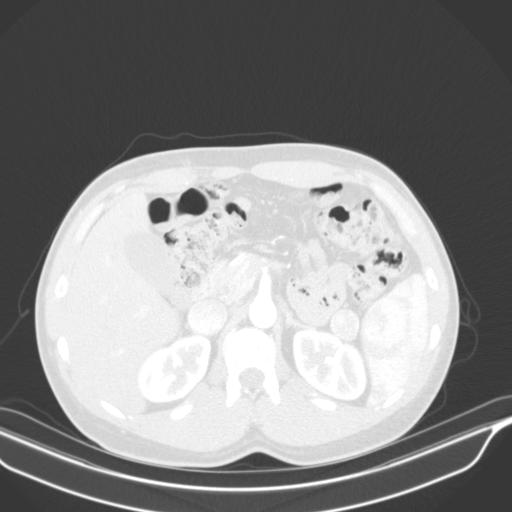
[im 10/98  soft-tissue]
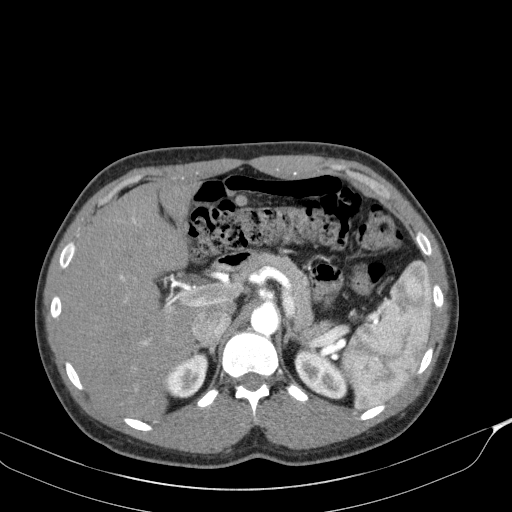
[im 13/98  lung]
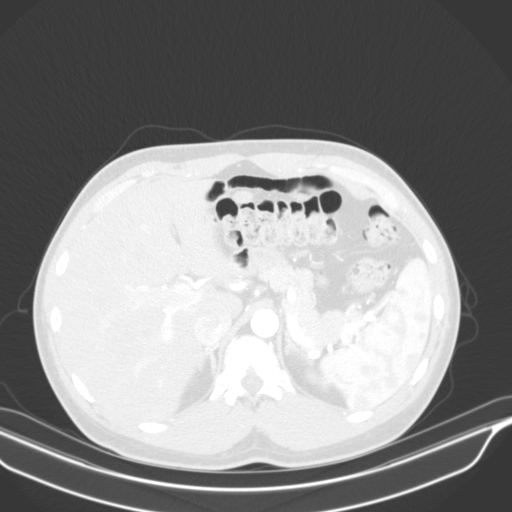
[im 19/98  soft-tissue]
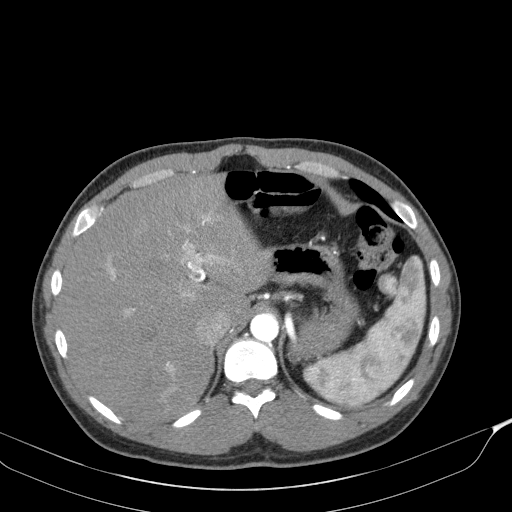
[im 26/98  lung]
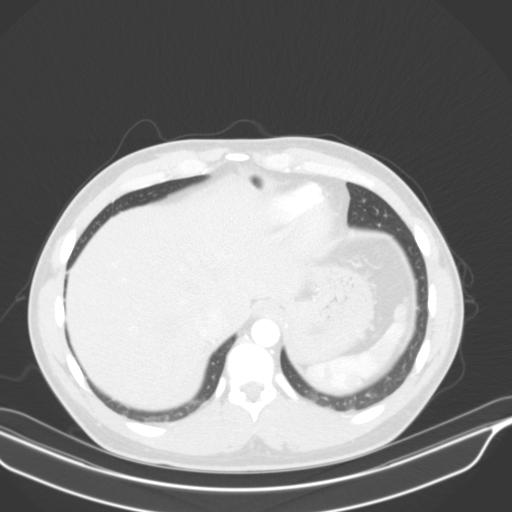
[im 29/98  soft-tissue]
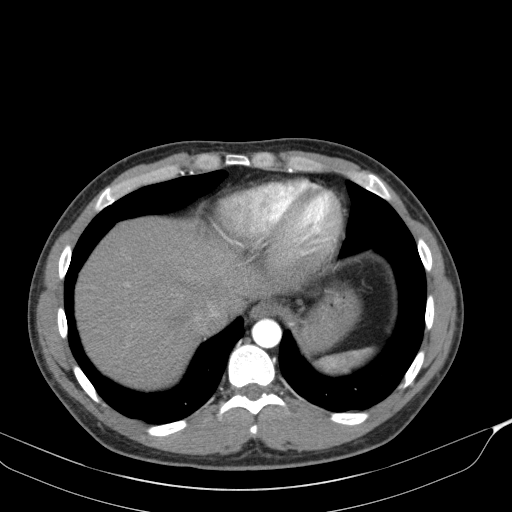
[im 35/98  lung]
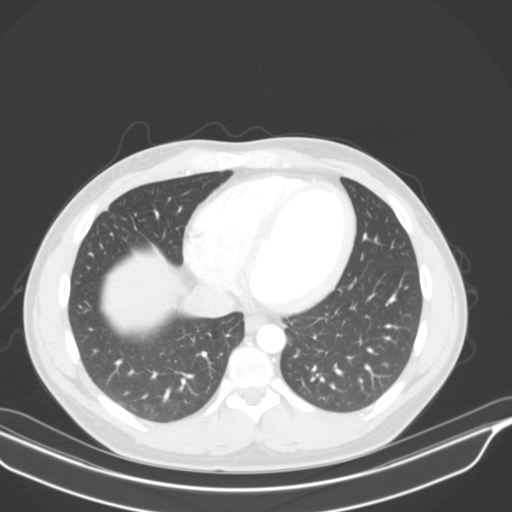
[im 38/98  soft-tissue]
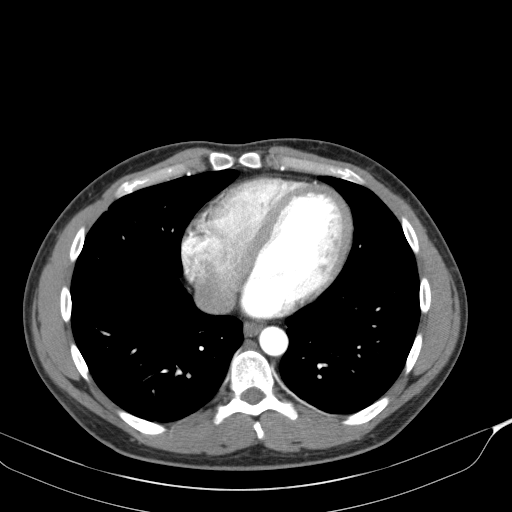
[im 44/98  lung]
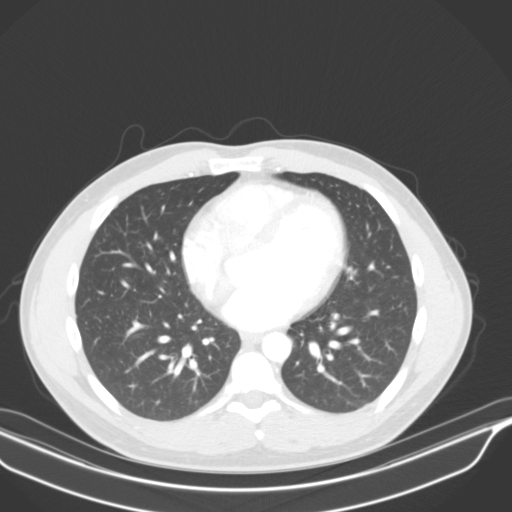
[im 51/98  soft-tissue]
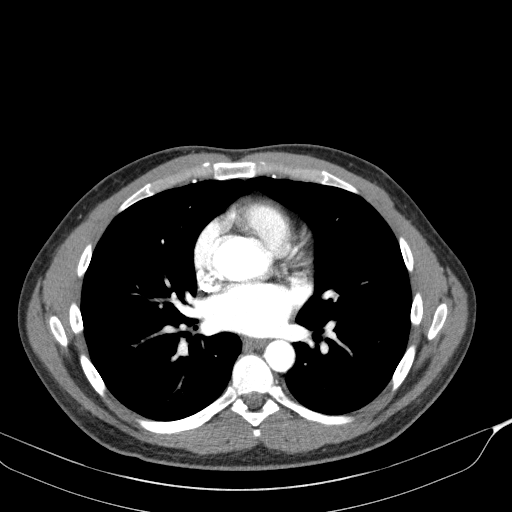
[im 54/98  lung]
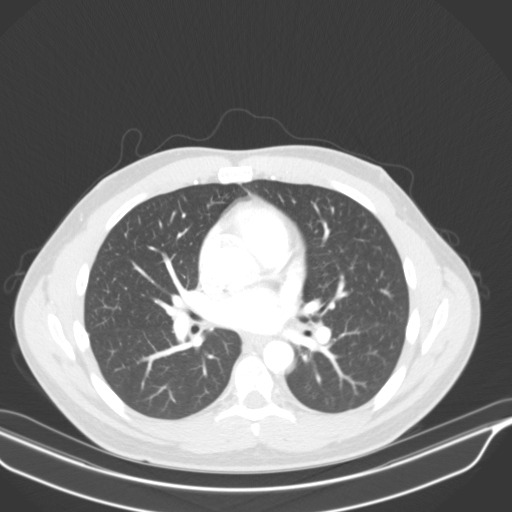
[im 60/98  soft-tissue]
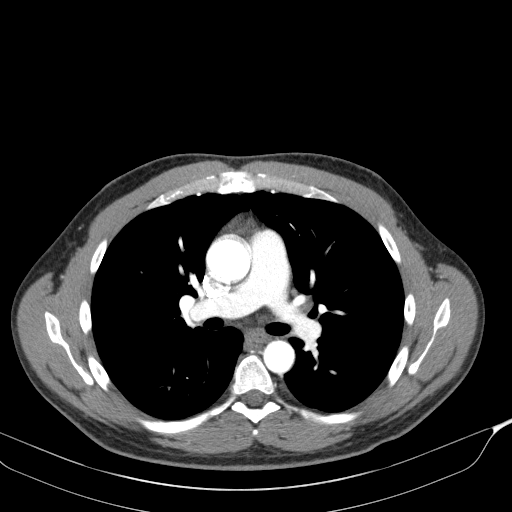
[im 63/98  lung]
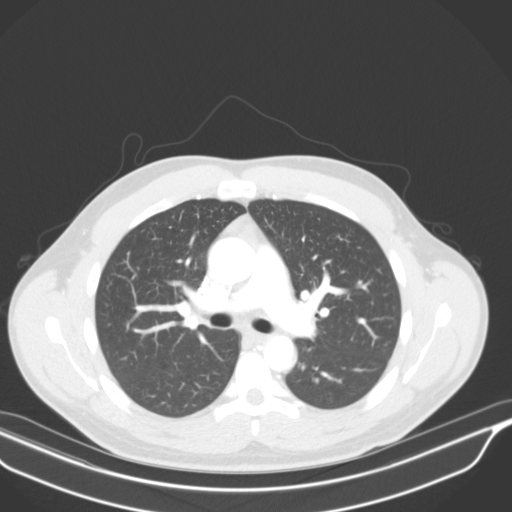
[im 69/98  soft-tissue]
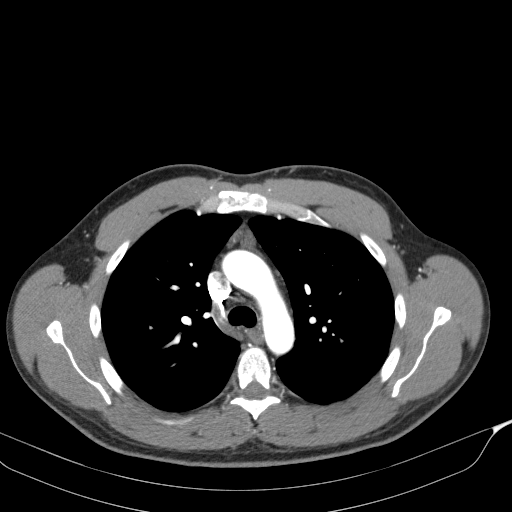
[im 72/98  lung]
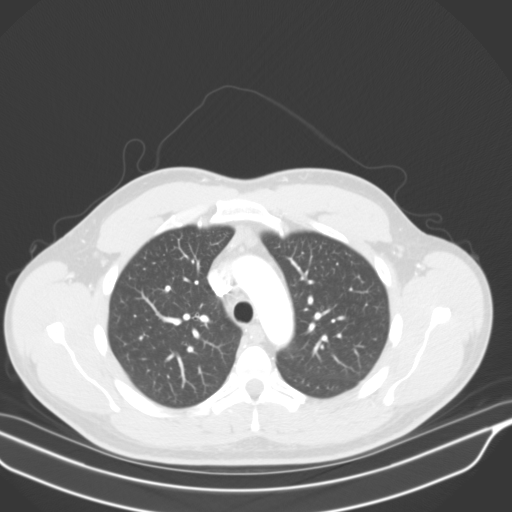
[im 79/98  soft-tissue]
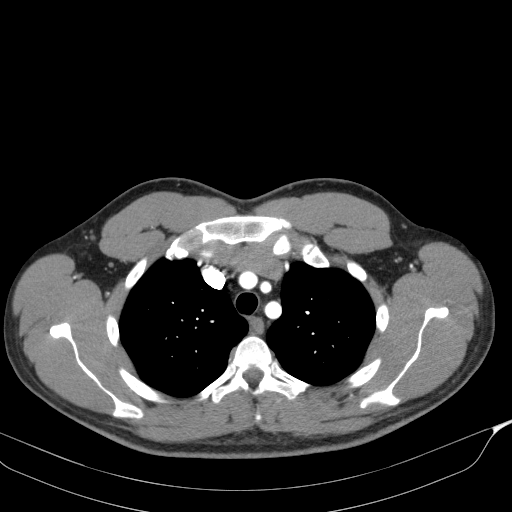
[im 85/98  lung]
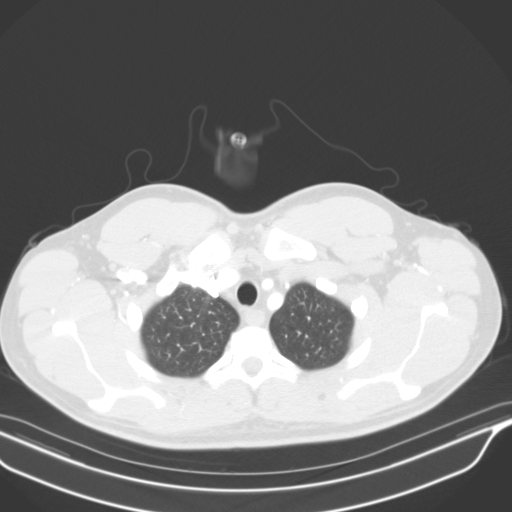
[im 88/98  soft-tissue]
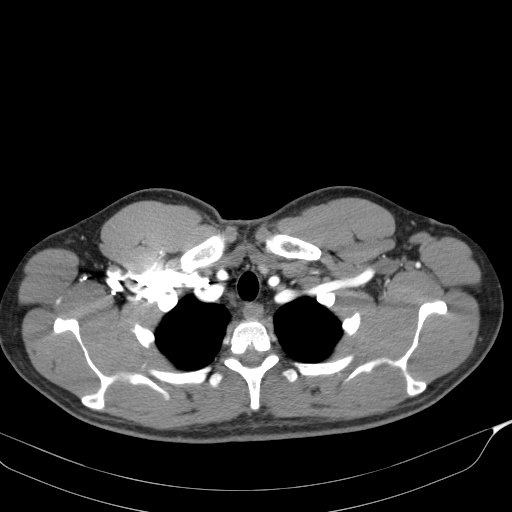
[im 94/98  lung]
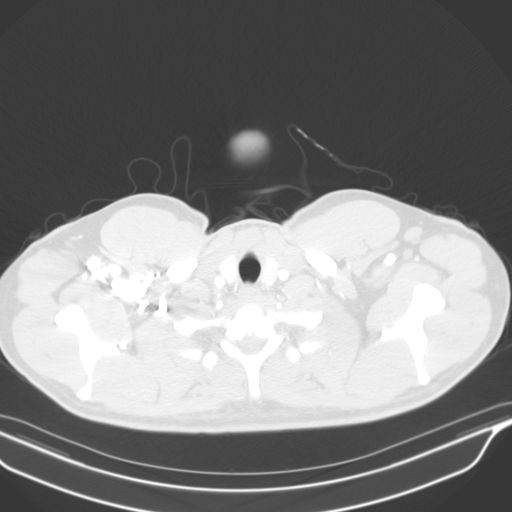

[19 of 32 positions shown; findings below may reference images not displayed]

FINDINGS: Cardiovascular: The thoracic aorta is normal in caliber. The aortic
root measures approximately 3.8 cm at the level of the sinuses of
Valsalva, the ascending thoracic aorta measures approximately
cm, the aortic arch measures approximately 2.8 cm and the descending
thoracic aorta 2.2 cm. Proximal great vessels show normal branching
anatomy and normal patency. No evidence of dissection or
atherosclerosis.

Central pulmonary arteries are normal in caliber. The heart size is
normal. No pericardial fluid identified.

Mediastinum/Nodes: No enlarged mediastinal, hilar, or axillary lymph
nodes. Thyroid gland, trachea, and esophagus demonstrate no
significant findings.

Lungs/Pleura: There is no evidence of pulmonary edema,
consolidation, pneumothorax, nodule or pleural fluid.

Upper Abdomen: No acute abnormality.

Musculoskeletal: No chest wall abnormality. No acute or significant
osseous findings.

Review of the MIP images confirms the above findings.
IMPRESSION: Normal CTA of the chest. There is no evidence of aneurysmal disease
of the thoracic aorta.
